# Patient Record
Sex: Male | Born: 1990 | Race: White | Hispanic: No | Marital: Single | Smoking: Current some day smoker
Health system: Southern US, Community
[De-identification: ages and names within clinical notes are randomized; demographics above are authoritative.]

## PROBLEM LIST (undated history)

## (undated) DIAGNOSIS — M940 Chondrocostal junction syndrome [Tietze]: Secondary | ICD-10-CM

## (undated) HISTORY — PX: SHOULDER SURGERY: SHX246

## (undated) HISTORY — PX: OTHER SURGICAL HISTORY: SHX169

---
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
## 2015-09-18 ENCOUNTER — Ambulatory Visit: Payer: Self-pay | Admitting: Nurse Practitioner

## 2016-05-19 ENCOUNTER — Emergency Department (HOSPITAL_COMMUNITY): Payer: BLUE CROSS/BLUE SHIELD

## 2016-05-19 ENCOUNTER — Emergency Department (HOSPITAL_COMMUNITY)
Admission: EM | Admit: 2016-05-19 | Discharge: 2016-05-19 | Disposition: A | Discharge status: Home / Self Care | Payer: BLUE CROSS/BLUE SHIELD | Attending: Emergency Medicine | Admitting: Emergency Medicine

## 2016-05-19 ENCOUNTER — Encounter (HOSPITAL_COMMUNITY): Payer: Self-pay | Admitting: Emergency Medicine

## 2016-05-19 DIAGNOSIS — Y939 Activity, unspecified: Secondary | ICD-10-CM | POA: Diagnosis not present

## 2016-05-19 DIAGNOSIS — F17200 Nicotine dependence, unspecified, uncomplicated: Secondary | ICD-10-CM | POA: Diagnosis not present

## 2016-05-19 DIAGNOSIS — Y999 Unspecified external cause status: Secondary | ICD-10-CM | POA: Diagnosis not present

## 2016-05-19 DIAGNOSIS — S299XXA Unspecified injury of thorax, initial encounter: Secondary | ICD-10-CM | POA: Diagnosis present

## 2016-05-19 DIAGNOSIS — M940 Chondrocostal junction syndrome [Tietze]: Secondary | ICD-10-CM | POA: Diagnosis not present

## 2016-05-19 DIAGNOSIS — Y929 Unspecified place or not applicable: Secondary | ICD-10-CM | POA: Insufficient documentation

## 2016-05-19 DIAGNOSIS — X58XXXA Exposure to other specified factors, initial encounter: Secondary | ICD-10-CM | POA: Insufficient documentation

## 2016-05-19 HISTORY — DX: Chondrocostal junction syndrome (tietze): M94.0

## 2016-05-19 MED ORDER — PREDNISONE 10 MG PO TABS: ORAL_TABLET | ORAL | 0 refills | Status: DC

## 2016-05-19 MED ORDER — TRAMADOL HCL 50 MG PO TABS: 50.0000 mg | ORAL_TABLET | Freq: Four times a day (QID) | ORAL | 0 refills | Status: DC | PRN

## 2016-05-19 MED ORDER — TRAMADOL HCL 50 MG PO TABS
50.0000 mg | ORAL_TABLET | Freq: Once | ORAL | Status: AC
  Administered 2016-05-19: 50 mg via ORAL
  Filled 2016-05-19: qty 1

## 2016-05-19 MED ORDER — PREDNISONE 50 MG PO TABS
60.0000 mg | ORAL_TABLET | Freq: Once | ORAL | Status: AC
  Administered 2016-05-19: 60 mg via ORAL
  Filled 2016-05-19: qty 1

## 2016-05-19 NOTE — ED Triage Notes (Signed)
Pt reports was diagnosed with costochondritis and reports recent flare-up started last Sunday. Pt reports otc medication is not helping with pain anymore. Pt denies any new or recent injury.

## 2016-05-19 NOTE — ED Provider Notes (Signed)
[facility] DEPT Provider Note   CSN: [id number] Arrival date & time: 05/19/16  1547  First Provider Contact:  None    By signing my name below, I, Majel Homer, attest that this documentation has been prepared under the direction and in the presence of non-physician practitioner, Burgess Amor, PA-C. Electronically Signed: Majel Homer, Scribe. 05/19/2016. 5:42 PM.  History   Chief Complaint Chief Complaint  Patient presents with  . Rib Injury   The history is provided by the patient. No language interpreter was used.   HPI Comments: John Mcpherson is a 25 y.o. male with PMHx of costochondritis and heart arrhythmia, who presents to the Emergency Department complaining of gradually worsening, intermittent, "dull," left rib pain that began 1 week ago and worsened today. 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 He states his pain is exacerbated when taking a deep breath and during certain stretching movements. He reports he takes ibuprofen and aleve every day for his pain and has also tried ice and heat with relief. He states his last dose of Ibuprofen was 2 hours PTA. 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 He also denies recent injury or illness, shortness of breath, back pain, nausea and vomiting. Pt reports he was diagnosed with costochondritis as a young child and was referred to a specialist but was never told of a direct cause of his pain. He reports a hx of smoking but states he has decreased recently as it exacerbated his pain; he denies drinking EtOH and illicit drug use. He states he has an appointment scheduled to see a PCP in [place] next week.   Past Medical History:  Diagnosis Date  . Costochondritis    Patient Active Problem List   Diagnosis Date Noted  . SUPERIOR GLENOID LABRUM TEAR 04/24/2008  . DISLOCATION CLOSED HUMERUS, ANTERIOR 12/30/2007     Past Surgical History:  Procedure Laterality Date  . rib injury    . SHOULDER SURGERY Right     Home Medications    Prior to Admission medications   Medication Sig Start Date End Date Taking? Authorizing Provider  predniSONE (DELTASONE) 10 MG tablet 6, 5, 4, 3, 2 then 1 tablet by mouth daily for 6 days total. 05/19/16   Burgess Amor, PA-C  traMADol (ULTRAM) 50 MG tablet Take 1 tablet (50 mg total) by mouth every 6 (six) hours as needed. 05/19/16   Burgess Amor, PA-C   Family History No family history on file.  Social History Social History  Substance Use Topics  . Smoking status: Current Every Day Smoker    Packs/day: 1.00  . Smokeless tobacco: Never Used  . Alcohol use No   Allergies   Review of patient's allergies indicates not on file.   Review of Systems Review of Systems  Respiratory: Negative for shortness of breath.   Gastrointestinal: Negative for nausea and vomiting.  Musculoskeletal: Positive for arthralgias (left ribs). Negative for back pain.   Physical Exam Updated Vital Signs BP 140/75 (BP Location: Left Arm)   Pulse 99   Temp 98.5 F (36.9 C) (Oral)   Resp 14   Ht 5\' 6"  (1.676 m)   Wt 141 lb (64 kg)   SpO2 100%   BMI 22.76 kg/m   Physical Exam  Constitutional: He is oriented to person, place, and time. He appears well-developed and well-nourished.  HENT:  Head:  Normocephalic and atraumatic.  Eyes: Conjunctivae are normal. Pupils are equal, round, and reactive to light. Right eye exhibits no discharge. Left eye exhibits no discharge. No scleral icterus.  Neck: Normal range of motion. No JVD present. No tracheal deviation present.  Cardiovascular:  Borderline tachycardic; rare pvc appreciated  Pulmonary/Chest: Effort normal. No stridor. No respiratory distress. He has no rales. He exhibits tenderness.  Tender along left anterior chest wall at the mid axillary line without palpable deformity, no crepitus.  Lungs are clear bilaterally.   Musculoskeletal: Normal range of motion. He exhibits no edema or tenderness.  Neurological: He is alert and oriented to person, place, and time. Coordination normal.  Skin:  Skin is intact without rash or lesions.  Psychiatric: He has a normal mood and affect. His behavior is normal. Judgment and thought content normal.  Nursing note and vitals reviewed.  ED Treatments / Results  Labs (all labs ordered are listed, but only abnormal results are displayed) Labs Reviewed - No data to display  EKG  EKG Interpretation  Date/Time:  Monday May 19 2016 17:41:30 EDT Ventricular Rate:  83 PR Interval:  124 QRS Duration: 82 QT Interval:  328 QTC Calculation: 385 R Axis:   80 Text Interpretation:  Normal sinus rhythm Normal ECG No previous ECGs available Confirmed by Bebe Shaggy  MD, DONALD ([id number]) on 05/19/2016 5:50:57 PM      Radiology Dg Ribs Unilateral W/chest Left  Result Date: 05/19/2016 CLINICAL DATA:  Left rib pain without known injury. EXAM: LEFT RIBS AND CHEST - 3+ VIEW COMPARISON:  None. FINDINGS: No fracture or other bone lesions are seen involving the ribs. There is no evidence of pneumothorax or pleural effusion. Both lungs are clear. Heart size and mediastinal contours are within normal limits. IMPRESSION: Normal left ribs.  No acute cardiopulmonary abnormality seen. Electronically Signed   By: Lupita Raider, M.D.   On: 05/19/2016 18:26   Procedures Procedures  DIAGNOSTIC STUDIES:  Oxygen Saturation is 100% on RA, normal by my interpretation.    COORDINATION OF CARE:  5:36 PM Discussed treatment plan, which includes X-ray of his ribs and EKG with pt at bedside and pt agreed to plan.  Medications Ordered in ED Medications  predniSONE (DELTASONE) tablet 60 mg (60 mg Oral Given 05/19/16 1915)  traMADol (ULTRAM) tablet 50 mg (50 mg Oral Given 05/19/16 1915)    Initial Impression / Assessment and Plan / ED Course  I have reviewed the triage vital signs and the nursing  notes.  Pertinent labs & imaging results that were available during my care of the patient were reviewed by me and considered in my medical decision making (see chart for details).  Clinical Course    Reproducible chest wall pain in a patient with chronic intermittent sx of unclear etiology.  He is very active in his job in [employer], possibly activity related chronic strain.  Ekg and cxr normal. No exam findings or risk factors suggesting PE.  He was prescribed prednisone and tramadol, advised heat tx.  F/u with new pcp next week as already planned.  Return here in the interim for any worsened sx.   I personally performed the services described in this documentation, which was scribed in my presence. The recorded information has been reviewed and is accurate.   Final Clinical Impressions(s) / ED Diagnoses   Final diagnoses:  Acute costochondritis    New Prescriptions Discharge Medication List as of 05/19/2016  7:09 PM    START taking these medications  Details  predniSONE (DELTASONE) 10 MG tablet 6, 5, 4, 3, 2 then 1 tablet by mouth daily for 6 days total., Print    traMADol (ULTRAM) 50 MG tablet Take 1 tablet (50 mg total) by mouth every 6 (six) hours as needed., Starting Mon 05/19/2016, Print         Burgess AmorJulie Lysander Calixte, PA-C 05/20/16 1410    Zadie Rhineonald Wickline, MD 05/21/16 [id number]

## 2016-07-12 ENCOUNTER — Emergency Department (HOSPITAL_COMMUNITY)
Admission: EM | Admit: 2016-07-12 | Discharge: 2016-07-12 | Disposition: A | Discharge status: Home / Self Care | Payer: BLUE CROSS/BLUE SHIELD | Attending: Emergency Medicine | Admitting: Emergency Medicine

## 2016-07-12 ENCOUNTER — Encounter (HOSPITAL_COMMUNITY): Payer: Self-pay | Admitting: Emergency Medicine

## 2016-07-12 ENCOUNTER — Emergency Department (HOSPITAL_COMMUNITY): Payer: BLUE CROSS/BLUE SHIELD

## 2016-07-12 DIAGNOSIS — R079 Chest pain, unspecified: Secondary | ICD-10-CM | POA: Diagnosis not present

## 2016-07-12 DIAGNOSIS — Z79899 Other long term (current) drug therapy: Secondary | ICD-10-CM | POA: Insufficient documentation

## 2016-07-12 DIAGNOSIS — Z7982 Long term (current) use of aspirin: Secondary | ICD-10-CM | POA: Insufficient documentation

## 2016-07-12 DIAGNOSIS — F17210 Nicotine dependence, cigarettes, uncomplicated: Secondary | ICD-10-CM | POA: Diagnosis not present

## 2016-07-12 MED ORDER — AMOXICILLIN-POT CLAVULANATE 875-125 MG PO TABS: 1.0000 | ORAL_TABLET | Freq: Two times a day (BID) | ORAL | 0 refills | Status: DC

## 2016-07-12 MED ORDER — KETOROLAC TROMETHAMINE 60 MG/2ML IM SOLN
60.0000 mg | Freq: Once | INTRAMUSCULAR | Status: AC
  Administered 2016-07-12: 60 mg via INTRAMUSCULAR
  Filled 2016-07-12: qty 2

## 2016-07-12 MED ORDER — IBUPROFEN 800 MG PO TABS: 800.0000 mg | ORAL_TABLET | Freq: Three times a day (TID) | ORAL | 0 refills | Status: AC

## 2016-07-12 NOTE — Discharge Instructions (Signed)
Your xray shows some abnormal Right lung findings that may be a pneumonia - you MUST have a recheck in 2 weeks with repeat xray - come back to ER sooner if your symptoms worsen such as chest pain, cough or fevers or difficulty breathing.

## 2016-07-12 NOTE — ED Triage Notes (Signed)
Pt presents with complaints of right sided chest pain,  described as a sharp shooting pain.  Pt states the pain makes it hard to take a deep breath. Pt states this pain has been present for last couple days. Pt states the pain is more so in the middle of the right ribs and extends towards the back.

## 2016-07-12 NOTE — ED Notes (Signed)
Patient transported to X-ray 

## 2016-07-12 NOTE — ED Provider Notes (Signed)
[facility] Provider Note   CSN: [id number] Arrival date & time: 07/12/16  1028  By signing my name below, I, Valentino SaxonBianca Contreras and Doreatha MartinEva Mathews, attest that this documentation has been prepared under the direction and in the presence of Eber HongBrian Burgandy Hackworth, MD. Electronically Signed: Valentino SaxonBianca Contreras and Doreatha MartinEva Mathews, ED Scribe. 07/12/16. 10:50 AM.    History   Chief Complaint Chief Complaint  Patient presents with  . Chest Pain    The history is provided by the patient. No language interpreter was used.     HPI Comments: John Mcpherson is a 25 y.o. male with h/o recurrent left-sided costochondritis who presents to the Emergency Department complaining of moderate, stabbing right-sided CP onset 2 days ago while at work. Pt denies any heavy lifting, falls or trauma to the chest. He also notes an occasional cough. Pt states his current pain is not similar to prior episodes of left-sided costochondritis. He notes he is normally able to take Aleve with resolution of his recurrent pain. Pt states he has taken Aleve and ibuprofen for his current pain with no relief. Pt states his pain is exacerbated by coughing, sneezing, in certain positions and with any breathing. Pt reports recent 5 hr car travel to [place]. He states he is not exposed to chemicals or insulation at work. Pt is a current occasional smoker. Pt denies fever, leg swelling, back pain, substernal CP, left-sided CP.     Past Medical History:  Diagnosis Date  . Costochondritis     Patient Active Problem List   Diagnosis Date Noted  . SUPERIOR GLENOID LABRUM TEAR 04/24/2008  . DISLOCATION CLOSED HUMERUS, ANTERIOR 12/30/2007    Past Surgical History:  Procedure Laterality Date  . rib injury    . SHOULDER SURGERY Right        Home Medications    Prior to Admission medications   Medication Sig Start Date End Date Taking? Authorizing Provider  acetaminophen (TYLENOL) 500 MG tablet Take 1,000 mg by mouth every 6 (six)  hours as needed for mild pain.   Yes Historical Provider, MD  naproxen sodium (ANAPROX) 220 MG tablet Take 440 mg by mouth daily as needed.   Yes Historical Provider, MD  amoxicillin-clavulanate (AUGMENTIN) 875-125 MG tablet Take 1 tablet by mouth every 12 (twelve) hours. 07/12/16   Eber HongBrian Norm Wray, MD  ibuprofen (ADVIL,MOTRIN) 800 MG tablet Take 1 tablet (800 mg total) by mouth 3 (three) times daily. 07/12/16   Eber HongBrian Lorece Keach, MD    Family History No family history on file.  Social History Social History  Substance Use Topics  . Smoking status: Current Some Day Smoker    Packs/day: 0.25    Types: Cigarettes  . Smokeless tobacco: Never Used  . Alcohol use No     Allergies   Review of patient's allergies indicates no known allergies.   Review of Systems Review of Systems  Constitutional: Negative for fever.  Respiratory: Positive for cough.   Cardiovascular: Positive for chest pain. Negative for leg swelling.    Physical Exam Updated Vital Signs BP 124/74   Pulse 101   Temp 99.2 F (37.3 C) (Oral)   Resp 18   Ht 5\' 7"  (1.702 m)   Wt 145 lb (65.8 kg)   SpO2 100%   BMI 22.71 kg/m   Physical Exam  Constitutional: He appears well-developed and well-nourished. No distress.  HENT:  Head: Normocephalic and atraumatic.  Mouth/Throat: Oropharynx is clear and moist. No oropharyngeal exudate.  Eyes: Conjunctivae and EOM  are normal. Pupils are equal, round, and reactive to light. Right eye exhibits no discharge. Left eye exhibits no discharge. No scleral icterus.  Neck: Normal range of motion. Neck supple. No JVD present. No thyromegaly present.  Cardiovascular: Normal rate, regular rhythm, normal heart sounds and intact distal pulses.  Exam reveals no gallop and no friction rub.   No murmur heard. Pulmonary/Chest: Effort normal and breath sounds normal. No respiratory distress. He has no wheezes. He has no rales. He exhibits tenderness.  Tenderness over right rib. Unable to take a  deep breath d/t pain. Breathing truncated by pain  Abdominal: Soft. Bowel sounds are normal. He exhibits no distension and no mass. There is no tenderness.  Musculoskeletal: Normal range of motion. He exhibits no edema or tenderness.  Lymphadenopathy:    He has no cervical adenopathy.  Neurological: He is alert. Coordination normal.  Skin: Skin is warm and dry. No rash noted. No erythema.  Psychiatric: He has a normal mood and affect. His behavior is normal.  Nursing note and vitals reviewed.    ED Treatments / Results   DIAGNOSTIC STUDIES: Oxygen Saturation is 100% on RA, normal by my interpretation.    COORDINATION OF CARE: 10:58 AM Discussed treatment plan with pt at bedside which includes CXR and pt agreed to plan.  Labs (all labs ordered are listed, but only abnormal results are displayed) Labs Reviewed - No data to display  EKG  EKG Interpretation None       Radiology Dg Chest 2 View  Result Date: 07/12/2016 CLINICAL DATA:  Pain. EXAM: CHEST  2 VIEW COMPARISON:  May 19, 2016 FINDINGS: No pneumothorax. The heart, hila, and mediastinum are normal. The left lung is clear. Mild opacity is seen in the right costophrenic angle with minimal blunting identified as well. No other abnormalities are identified. IMPRESSION: Mild opacity and effusion in the region of the right costophrenic angle. Recommend follow-up to resolution. No other acute abnormalities are identified. Electronically Signed   By: Gerome Sam III M.D   On: 07/12/2016 11:19    Procedures Procedures (including critical care time)  Medications Ordered in ED Medications  ketorolac (TORADOL) injection 60 mg (60 mg Intramuscular Given 07/12/16 1144)     Initial Impression / Assessment and Plan / ED Course  I have reviewed the triage vital signs and the nursing notes.  Pertinent labs & imaging results that were available during my care of the patient were reviewed by me and considered in my medical  decision making (see chart for details).  Clinical Course   Lung exam is normal Pt informed of results - I showed him the xray pictures in his room on CPU screen Will treat for possible PNA Pt will need f/u in 2 weeks for repeat He is in agreement with plan nsaids for pain  Final Clinical Impressions(s) / ED Diagnoses   Final diagnoses:  Nonspecific chest pain    New Prescriptions Discharge Medication List as of 07/12/2016 11:34 AM    START taking these medications   Details  amoxicillin-clavulanate (AUGMENTIN) 875-125 MG tablet Take 1 tablet by mouth every 12 (twelve) hours., Starting Sat 07/12/2016, Print        I personally performed the services described in this documentation, which was scribed in my presence. The recorded information has been reviewed and is accurate.        Eber Hong, MD 07/12/16 [phone]

## 2016-12-26 ENCOUNTER — Ambulatory Visit (HOSPITAL_BASED_OUTPATIENT_CLINIC_OR_DEPARTMENT_OTHER): Payer: BLUE CROSS/BLUE SHIELD | Admitting: Anesthesiology

## 2016-12-26 ENCOUNTER — Ambulatory Visit (HOSPITAL_BASED_OUTPATIENT_CLINIC_OR_DEPARTMENT_OTHER)
Admission: RE | Admit: 2016-12-26 | Discharge: 2016-12-26 | Disposition: A | Discharge status: Home / Self Care | Payer: BLUE CROSS/BLUE SHIELD | Source: Ambulatory Visit | Attending: Orthopedic Surgery | Admitting: Orthopedic Surgery

## 2016-12-26 ENCOUNTER — Encounter (HOSPITAL_BASED_OUTPATIENT_CLINIC_OR_DEPARTMENT_OTHER): Payer: Self-pay | Admitting: *Deleted

## 2016-12-26 ENCOUNTER — Other Ambulatory Visit (HOSPITAL_COMMUNITY): Payer: Self-pay | Admitting: Physician Assistant

## 2016-12-26 ENCOUNTER — Encounter (HOSPITAL_BASED_OUTPATIENT_CLINIC_OR_DEPARTMENT_OTHER)
Admission: RE | Disposition: A | Discharge status: Home / Self Care | Payer: Self-pay | Source: Ambulatory Visit | Attending: Orthopedic Surgery

## 2016-12-26 DIAGNOSIS — B957 Other staphylococcus as the cause of diseases classified elsewhere: Secondary | ICD-10-CM | POA: Diagnosis not present

## 2016-12-26 DIAGNOSIS — L02413 Cutaneous abscess of right upper limb: Secondary | ICD-10-CM | POA: Diagnosis present

## 2016-12-26 DIAGNOSIS — F17200 Nicotine dependence, unspecified, uncomplicated: Secondary | ICD-10-CM | POA: Diagnosis not present

## 2016-12-26 HISTORY — PX: INCISION AND DRAINAGE ABSCESS: SHX5864

## 2016-12-26 LAB — RAPID URINE DRUG SCREEN, HOSP PERFORMED
Amphetamines: NOT DETECTED
Barbiturates: NOT DETECTED
Benzodiazepines: NOT DETECTED
COCAINE: NOT DETECTED
OPIATES: NOT DETECTED
Tetrahydrocannabinol: POSITIVE — AB

## 2016-12-26 SURGERY — INCISION AND DRAINAGE, ABSCESS
Anesthesia: General | Site: Shoulder | Laterality: Right

## 2016-12-26 MED ORDER — FENTANYL CITRATE (PF) 100 MCG/2ML IJ SOLN
INTRAMUSCULAR | Status: AC
  Filled 2016-12-26: qty 2

## 2016-12-26 MED ORDER — PROPOFOL 10 MG/ML IV BOLUS
INTRAVENOUS | Status: DC | PRN
  Administered 2016-12-26: 200 mg via INTRAVENOUS

## 2016-12-26 MED ORDER — ONDANSETRON HCL 4 MG PO TABS: 4.0000 mg | ORAL_TABLET | Freq: Three times a day (TID) | ORAL | 0 refills | Status: AC | PRN

## 2016-12-26 MED ORDER — FENTANYL CITRATE (PF) 100 MCG/2ML IJ SOLN
INTRAMUSCULAR | Status: DC | PRN
  Administered 2016-12-26: 50 ug via INTRAVENOUS
  Administered 2016-12-26: 100 ug via INTRAVENOUS

## 2016-12-26 MED ORDER — CEFAZOLIN SODIUM-DEXTROSE 2-4 GM/100ML-% IV SOLN
INTRAVENOUS | Status: AC
  Filled 2016-12-26: qty 100

## 2016-12-26 MED ORDER — PROMETHAZINE HCL 25 MG/ML IJ SOLN: 6.2500 mg | INTRAMUSCULAR | Status: DC | PRN

## 2016-12-26 MED ORDER — OXYCODONE-ACETAMINOPHEN 5-325 MG PO TABS: 1.0000 | ORAL_TABLET | ORAL | 0 refills | Status: AC | PRN

## 2016-12-26 MED ORDER — ONDANSETRON HCL 4 MG/2ML IJ SOLN
INTRAMUSCULAR | Status: DC | PRN
  Administered 2016-12-26: 4 mg via INTRAVENOUS

## 2016-12-26 MED ORDER — HYDROMORPHONE HCL 1 MG/ML IJ SOLN
0.2500 mg | INTRAMUSCULAR | Status: DC | PRN
  Administered 2016-12-26 (×2): 0.5 mg via INTRAVENOUS

## 2016-12-26 MED ORDER — CEFAZOLIN SODIUM-DEXTROSE 2-4 GM/100ML-% IV SOLN
2.0000 g | INTRAVENOUS | Status: AC
  Administered 2016-12-26: 2 g via INTRAVENOUS

## 2016-12-26 MED ORDER — DEXAMETHASONE SODIUM PHOSPHATE 10 MG/ML IJ SOLN
INTRAMUSCULAR | Status: AC
  Filled 2016-12-26: qty 1

## 2016-12-26 MED ORDER — METHOCARBAMOL 500 MG PO TABS: 500.0000 mg | ORAL_TABLET | Freq: Four times a day (QID) | ORAL | 0 refills | Status: AC | PRN

## 2016-12-26 MED ORDER — OXYCODONE HCL 5 MG PO TABS
ORAL_TABLET | ORAL | Status: AC
  Filled 2016-12-26: qty 1

## 2016-12-26 MED ORDER — BUPIVACAINE HCL (PF) 0.5 % IJ SOLN
INTRAMUSCULAR | Status: AC
  Filled 2016-12-26: qty 30

## 2016-12-26 MED ORDER — ONDANSETRON HCL 4 MG/2ML IJ SOLN
INTRAMUSCULAR | Status: AC
  Filled 2016-12-26: qty 2

## 2016-12-26 MED ORDER — MIDAZOLAM HCL 5 MG/5ML IJ SOLN
INTRAMUSCULAR | Status: DC | PRN
  Administered 2016-12-26: 2 mg via INTRAVENOUS

## 2016-12-26 MED ORDER — DEXAMETHASONE SODIUM PHOSPHATE 4 MG/ML IJ SOLN
INTRAMUSCULAR | Status: DC | PRN
  Administered 2016-12-26: 10 mg via INTRAVENOUS

## 2016-12-26 MED ORDER — SODIUM CHLORIDE 0.9 % IR SOLN
Status: DC | PRN
  Administered 2016-12-26: 3000 mL

## 2016-12-26 MED ORDER — HYDROMORPHONE HCL 1 MG/ML IJ SOLN
INTRAMUSCULAR | Status: AC
  Filled 2016-12-26: qty 1

## 2016-12-26 MED ORDER — LACTATED RINGERS IV SOLN
INTRAVENOUS | Status: DC
  Administered 2016-12-26: 14:00:00 via INTRAVENOUS

## 2016-12-26 MED ORDER — BUPIVACAINE HCL (PF) 0.5 % IJ SOLN
INTRAMUSCULAR | Status: DC | PRN
Start: 2016-12-26 — End: 2016-12-26
  Administered 2016-12-26: 30 mL

## 2016-12-26 MED ORDER — SUCCINYLCHOLINE CHLORIDE 200 MG/10ML IV SOSY
PREFILLED_SYRINGE | INTRAVENOUS | Status: DC | PRN
  Administered 2016-12-26: 100 mg via INTRAVENOUS

## 2016-12-26 MED ORDER — PROPOFOL 10 MG/ML IV BOLUS
INTRAVENOUS | Status: AC
  Filled 2016-12-26: qty 20

## 2016-12-26 MED ORDER — LIDOCAINE 2% (20 MG/ML) 5 ML SYRINGE
INTRAMUSCULAR | Status: AC
  Filled 2016-12-26: qty 5

## 2016-12-26 MED ORDER — CHLORHEXIDINE GLUCONATE 4 % EX LIQD
60.0000 mL | Freq: Once | CUTANEOUS | Status: DC
Start: 2016-12-26 — End: 2016-12-26

## 2016-12-26 MED ORDER — CHLORHEXIDINE GLUCONATE 4 % EX LIQD: 60.0000 mL | Freq: Once | CUTANEOUS | Status: DC

## 2016-12-26 MED ORDER — OXYCODONE HCL 5 MG/5ML PO SOLN: 5.0000 mg | Freq: Once | ORAL | Status: AC | PRN

## 2016-12-26 MED ORDER — OXYCODONE HCL 5 MG PO TABS
5.0000 mg | ORAL_TABLET | Freq: Once | ORAL | Status: AC | PRN
  Administered 2016-12-26: 5 mg via ORAL

## 2016-12-26 MED ORDER — SUCCINYLCHOLINE CHLORIDE 200 MG/10ML IV SOSY
PREFILLED_SYRINGE | INTRAVENOUS | Status: AC
  Filled 2016-12-26: qty 10

## 2016-12-26 MED ORDER — MIDAZOLAM HCL 2 MG/2ML IJ SOLN
INTRAMUSCULAR | Status: AC
  Filled 2016-12-26: qty 2

## 2016-12-26 MED ORDER — LIDOCAINE 2% (20 MG/ML) 5 ML SYRINGE
INTRAMUSCULAR | Status: DC | PRN
  Administered 2016-12-26: 100 mg via INTRAVENOUS

## 2016-12-26 MED ORDER — ARTIFICIAL TEARS OP OINT
TOPICAL_OINTMENT | OPHTHALMIC | Status: AC
  Filled 2016-12-26: qty 3.5

## 2016-12-26 SURGICAL SUPPLY — 65 items
AID PSTN UNV HD RSTRNT DISP (MISCELLANEOUS)
BLADE CLIPPER SURG (BLADE) IMPLANT
BLADE SURG 15 STRL LF DISP TIS (BLADE) ×3 IMPLANT
BLADE SURG 15 STRL SS (BLADE) ×4
CHLORAPREP W/TINT 26ML (MISCELLANEOUS) ×4 IMPLANT
CLOSURE STERI-STRIP 1/2X4 (GAUZE/BANDAGES/DRESSINGS)
CLSR STERI-STRIP ANTIMIC 1/2X4 (GAUZE/BANDAGES/DRESSINGS) IMPLANT
DECANTER SPIKE VIAL GLASS SM (MISCELLANEOUS) IMPLANT
DRAIN PENROSE 1/4X12 LTX STRL (WOUND CARE) ×3 IMPLANT
DRAPE ARTHROSCOPY W/POUCH 114 (DRAPES) ×3 IMPLANT
DRAPE IMP U-DRAPE 54X76 (DRAPES) ×7 IMPLANT
DRAPE INCISE IOBAN 66X45 STRL (DRAPES) ×4 IMPLANT
DRAPE OEC MINIVIEW 54X84 (DRAPES) IMPLANT
DRAPE U-SHAPE 47X51 STRL (DRAPES) ×4 IMPLANT
DRAPE U-SHAPE 76X120 STRL (DRAPES) ×2 IMPLANT
DRSG MEPILEX BORDER 4X8 (GAUZE/BANDAGES/DRESSINGS) IMPLANT
DRSG PAD ABDOMINAL 8X10 ST (GAUZE/BANDAGES/DRESSINGS) ×6 IMPLANT
DRSG TEGADERM 4X4.75 (GAUZE/BANDAGES/DRESSINGS) IMPLANT
ELECT REM PT RETURN 9FT ADLT (ELECTROSURGICAL) ×4
ELECTRODE REM PT RTRN 9FT ADLT (ELECTROSURGICAL) ×2 IMPLANT
GAUZE SPONGE 4X4 12PLY STRL (GAUZE/BANDAGES/DRESSINGS) IMPLANT
GAUZE SPONGE 4X4 12PLY STRL LF (GAUZE/BANDAGES/DRESSINGS) ×3 IMPLANT
GAUZE XEROFORM 1X8 LF (GAUZE/BANDAGES/DRESSINGS) IMPLANT
GLOVE BIO SURGEON STRL SZ 6.5 (GLOVE) ×2 IMPLANT
GLOVE BIO SURGEON STRL SZ7.5 (GLOVE) ×14 IMPLANT
GLOVE BIO SURGEONS STRL SZ 6.5 (GLOVE) ×1
GLOVE BIOGEL PI IND STRL 7.0 (GLOVE) ×1 IMPLANT
GLOVE BIOGEL PI IND STRL 8 (GLOVE) ×4 IMPLANT
GLOVE BIOGEL PI INDICATOR 7.0 (GLOVE) ×2
GLOVE BIOGEL PI INDICATOR 8 (GLOVE) ×4
GOWN STRL REUS W/ TWL LRG LVL3 (GOWN DISPOSABLE) ×2 IMPLANT
GOWN STRL REUS W/ TWL XL LVL3 (GOWN DISPOSABLE) ×4 IMPLANT
GOWN STRL REUS W/TWL LRG LVL3 (GOWN DISPOSABLE) ×4
GOWN STRL REUS W/TWL XL LVL3 (GOWN DISPOSABLE) ×8
MANIFOLD NEPTUNE II (INSTRUMENTS) ×3 IMPLANT
NS IRRIG 1000ML POUR BTL (IV SOLUTION) ×4 IMPLANT
PACK ARTHROSCOPY DSU (CUSTOM PROCEDURE TRAY) ×4 IMPLANT
PACK BASIN DAY SURGERY FS (CUSTOM PROCEDURE TRAY) ×4 IMPLANT
PENCIL BUTTON HOLSTER BLD 10FT (ELECTRODE) ×4 IMPLANT
RESTRAINT HEAD UNIVERSAL NS (MISCELLANEOUS) ×1 IMPLANT
SET IRRIG Y TYPE TUR BLADDER L (SET/KITS/TRAYS/PACK) ×3 IMPLANT
SLEEVE SCD COMPRESS KNEE MED (MISCELLANEOUS) ×4 IMPLANT
SLING ARM FOAM STRAP LRG (SOFTGOODS) ×3 IMPLANT
SLING ARM IMMOBILIZER LRG (SOFTGOODS) IMPLANT
SLING ARM IMMOBILIZER MED (SOFTGOODS) IMPLANT
SLING ARM MED ADULT FOAM STRAP (SOFTGOODS) IMPLANT
SLING ARM XL FOAM STRAP (SOFTGOODS) IMPLANT
SPONGE LAP 18X18 X RAY DECT (DISPOSABLE) ×4 IMPLANT
SUCTION FRAZIER HANDLE 10FR (MISCELLANEOUS)
SUCTION TUBE FRAZIER 10FR DISP (MISCELLANEOUS) IMPLANT
SUT ETHILON 3 0 PS 1 (SUTURE) ×3 IMPLANT
SUT FIBERWIRE #2 38 T-5 BLUE (SUTURE)
SUT MON AB 2-0 CT1 36 (SUTURE) ×1 IMPLANT
SUT MON AB 4-0 PC3 18 (SUTURE) IMPLANT
SUT VIC AB 0 CT1 27 (SUTURE)
SUT VIC AB 0 CT1 27XBRD ANBCTR (SUTURE) ×1 IMPLANT
SUT VIC AB 2-0 SH 27 (SUTURE)
SUT VIC AB 2-0 SH 27XBRD (SUTURE) IMPLANT
SUT VIC AB 3-0 SH 27 (SUTURE)
SUT VIC AB 3-0 SH 27X BRD (SUTURE) IMPLANT
SUTURE FIBERWR #2 38 T-5 BLUE (SUTURE) IMPLANT
SYR BULB 3OZ (MISCELLANEOUS) ×4 IMPLANT
TOWEL OR 17X24 6PK STRL BLUE (TOWEL DISPOSABLE) ×4 IMPLANT
TOWEL OR NON WOVEN STRL DISP B (DISPOSABLE) ×4 IMPLANT
YANKAUER SUCT BULB TIP NO VENT (SUCTIONS) ×4 IMPLANT

## 2016-12-26 NOTE — Op Note (Signed)
12/26/2016  3:34 PM  PATIENT:  John Mcpherson    PRE-OPERATIVE DIAGNOSIS:  Right shoulder abscess  POST-OPERATIVE DIAGNOSIS:  Same  PROCEDURE:  INCISION AND DRAINAGE  SURGEON:  Bayla Mcgovern, Jewel BaizeIMOTHY D, MD  ASSISTANT: Aquilla HackerHenry Martensen, PA-C, he was present and scrubbed throughout the case, critical for completion in a timely fashion, and for retraction, instrumentation, and closure.   ANESTHESIA:   gen  PREOPERATIVE INDICATIONS:  John Mcpherson is a  26 y.o. male with a diagnosis of Right shoulder abscess who failed conservative measures and elected for surgical management.    The risks benefits and alternatives were discussed with the patient preoperatively including but not limited to the risks of infection, bleeding, nerve injury, cardiopulmonary complications, the need for revision surgery, among others, and the patient was willing to proceed.  OPERATIVE IMPLANTS: none  OPERATIVE FINDINGS: purulent fluid   BLOOD LOSS: min  COMPLICATIONS: none  TOURNIQUET TIME: none  OPERATIVE PROCEDURE:  Patient was identified in the preoperative holding area and site was marked by me He was transported to the operating theater and placed on the table in supine position taking care to pad all bony prominences. After a preincinduction time out anesthesia was induced. The right upper extremity was prepped and draped in normal sterile fashion and a pre-incision timeout was performed. He received ancef after culture collection for preoperative antibiotics.   I made a incision through his previous anterior shoulder incision. Immediately a large amount of purulent fluid was expressed and collected for culture I then dosed him with Ancef for antibiotics. I bluntly dissected in the plane of the abscess that tracked down towards his deltoid insertion and into the lateral arm slightly. I confirm that we expressed all fluid I probed the entire pocket there were no other areas of fluid collection it did not  go into his shoulder joint I was able to feel anterior capsule but there was no rent or hole in this. There is no expressed fluid by moving the shoulder or screws squeezing on his capsule.  I then copiously irrigated his abscess I debrided of all necrotic tissue. I used scissors knife and a Cobb to debride this. This was an excisional debridement.  After this debridement I performed another copious irrigation I then placed a Penrose drains and closed the skin with a nylon stitch. He was awoken and taken the PACU in stable condition  POST OPERATIVE PLAN: He'll start by mouth antibiotics to use a sling for comfort follow-up early next week for a wound check

## 2016-12-26 NOTE — Anesthesia Preprocedure Evaluation (Addendum)
Anesthesia Evaluation  Patient identified by MRN, date of birth, ID band Patient awake    Reviewed: Allergy & Precautions, NPO status , Patient's Chart, lab work & pertinent test results  Airway Mallampati: II  TM Distance: >3 FB Neck ROM: Full    Dental  (+) Teeth Intact, Poor Dentition   Pulmonary Current Smoker,    Pulmonary exam normal breath sounds clear to auscultation       Cardiovascular negative cardio ROS Normal cardiovascular exam Rhythm:Regular Rate:Normal     Neuro/Psych negative psych ROS   GI/Hepatic negative GI ROS, (+)     substance abuse  cocaine use and marijuana use,   Endo/Other  negative endocrine ROS  Renal/GU negative Renal ROS  negative genitourinary   Musculoskeletal negative musculoskeletal ROS (+) Infected shoulder site   Abdominal Normal abdominal exam  (+)   Peds  Hematology negative hematology ROS (+)   Anesthesia Other Findings   Reproductive/Obstetrics                           Anesthesia Physical Anesthesia Plan  ASA: II  Anesthesia Plan: General   Post-op Pain Management:    Induction: Intravenous  Airway Management Planned: Oral ETT  Additional Equipment:   Intra-op Plan:   Post-operative Plan: Extubation in OR  Informed Consent: I have reviewed the patients History and Physical, chart, labs and discussed the procedure including the risks, benefits and alternatives for the proposed anesthesia with the patient or authorized representative who has indicated his/her understanding and acceptance.     Plan Discussed with: CRNA and Surgeon  Anesthesia Plan Comments:        Anesthesia Quick Evaluation

## 2016-12-26 NOTE — Addendum Note (Signed)
Addendum  created 12/26/16 1721 by Leilani AbleFranklin Sweet Jarvis, MD   Order list changed, Order sets accessed

## 2016-12-26 NOTE — Anesthesia Procedure Notes (Signed)
Procedure Name: Intubation Date/Time: 12/26/2016 3:40 PM Performed by: Gar GibbonKEETON, Angell Honse S Pre-anesthesia Checklist: Patient identified, Emergency Drugs available, Suction available and Patient being monitored Patient Re-evaluated:Patient Re-evaluated prior to inductionOxygen Delivery Method: Circle system utilized Preoxygenation: Pre-oxygenation with 100% oxygen Intubation Type: IV induction Ventilation: Mask ventilation without difficulty Laryngoscope Size: Miller and 3 Grade View: Grade I Tube type: Oral Tube size: 8.0 mm Number of attempts: 1 Airway Equipment and Method: Stylet and Oral airway Placement Confirmation: ETT inserted through vocal cords under direct vision,  positive ETCO2 and breath sounds checked- equal and bilateral Secured at: 22 cm Tube secured with: Tape Dental Injury: Teeth and Oropharynx as per pre-operative assessment

## 2016-12-26 NOTE — Discharge Instructions (Signed)
Diet: As you were doing prior to hospitalization   Shower:  Keep dressing on, clean and dry until follow up.  Dressing:  Keep on and dry until follow up in 3-5 days.  Activity:  Increase activity slowly as tolerated, but follow the weight bearing instructions below.  The rules on driving is that you can not be taking narcotics while you drive, and you must feel in control of the vehicle.    Weight Bearing:   As tolerated.  Limit strenuous use of right arm.    To prevent constipation: you may use a stool softener such as -  Colace (over the counter) 100 mg by mouth twice a day  Drink plenty of fluids (prune juice may be helpful) and high fiber foods Miralax (over the counter) for constipation as needed.    Itching:  If you experience itching with your medications, try taking only a single pain pill, or even half a pain pill at a time.  You may take up to 10 pain pills per day, and you can also use benadryl over the counter for itching or also to help with sleep.   Precautions:  If you experience chest pain or shortness of breath - call 911 immediately for transfer to the hospital emergency department!!  If you develop a fever greater that 101 F, purulent drainage from wound, increased redness or drainage from wound, or calf pain -- Call the office at [phone]                                                Follow- Up Appointment:  Please call for an appointment to be seen in 3-5 days [place] - [phone]   Post Anesthesia Home Care Instructions  Activity: Get plenty of rest for the remainder of the day. A responsible adult should stay with you for 24 hours following the procedure.  For the next 24 hours, DO NOT: -Drive a car -Advertising copywriterperate machinery -Drink alcoholic beverages -Take any medication unless instructed by your physician -Make any legal decisions or sign important papers.  Meals: Start with liquid foods such as gelatin or soup. Progress to regular foods as tolerated.  Avoid greasy, spicy, heavy foods. If nausea and/or vomiting occur, drink only clear liquids until the nausea and/or vomiting subsides. Call your physician if vomiting continues.  Special Instructions/Symptoms: Your throat may feel dry or sore from the anesthesia or the breathing tube placed in your throat during surgery. If this causes discomfort, gargle with warm salt water. The discomfort should disappear within 24 hours.  If you had a scopolamine patch placed behind your ear for the management of post- operative nausea and/or vomiting:  1. The medication in the patch is effective for 72 hours, after which it should be removed.  Wrap patch in a tissue and discard in the trash. Wash hands thoroughly with soap and water. 2. You may remove the patch earlier than 72 hours if you experience unpleasant side effects which may include dry mouth, dizziness or visual disturbances. 3. Avoid touching the patch. Wash your hands with soap and water after contact with the patch.
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
## 2016-12-26 NOTE — Interval H&P Note (Signed)
History and Physical Interval Note:  12/26/2016 2:11 PM  John Mcpherson  has presented today for surgery, with the diagnosis of Right shoulder abscess  The various methods of treatment have been discussed with the patient and family. After consideration of risks, benefits and other options for treatment, the patient has consented to  Procedure(s): INCISION AND DRAINAGE (Right) as a surgical intervention .  The patient's history has been reviewed, patient examined, no change in status, stable for surgery.  I have reviewed the patient's chart and labs.  Questions were answered to the patient's satisfaction.     Jhoselin Crume D

## 2016-12-26 NOTE — Anesthesia Postprocedure Evaluation (Addendum)
Anesthesia Post Note  Patient: John Mcpherson  Procedure(s) Performed: Procedure(s) (LRB): INCISION AND DRAINAGE RIGHT SHOULDER ABSCESS (Right)  Patient location during evaluation: PACU Anesthesia Type: General Level of consciousness: awake Pain management: pain level controlled Vital Signs Assessment: post-procedure vital signs reviewed and stable Respiratory status: spontaneous breathing Cardiovascular status: blood pressure returned to baseline Postop Assessment: no signs of nausea or vomiting and adequate PO intake Anesthetic complications: no        Last Vitals:  Vitals:   12/26/16 1700 12/26/16 1715  BP: 112/66 118/80  Pulse: 87 94  Resp: 15 (!) 29  Temp:      Last Pain:  Vitals:   12/26/16 1700  TempSrc:   PainSc: 7    Pain Goal: Patients Stated Pain Goal: 3 (12/26/16 1424)               John Mcpherson,John Mcpherson

## 2016-12-26 NOTE — Transfer of Care (Signed)
Immediate Anesthesia Transfer of Care Note  Patient: John Mcpherson  Procedure(s) Performed: Procedure(s): INCISION AND DRAINAGE RIGHT SHOULDER ABSCESS (Right)  Patient Location: PACU  Anesthesia Type:General  Level of Consciousness: awake, sedated and patient cooperative  Airway & Oxygen Therapy: Patient Spontanous Breathing and Patient connected to face mask oxygen  Post-op Assessment: Report given to RN and Post -op Vital signs reviewed and stable  Post vital signs: Reviewed and stable  Last Vitals:  Vitals:   12/26/16 1424  BP: 127/81  Pulse: (!) 114  Resp: 20  Temp: 36.7 C    Last Pain:  Vitals:   12/26/16 1424  TempSrc: Oral  PainSc: 6       Patients Stated Pain Goal: 3 (12/26/16 1424)  Complications: No apparent anesthesia complications

## 2016-12-29 ENCOUNTER — Encounter (HOSPITAL_BASED_OUTPATIENT_CLINIC_OR_DEPARTMENT_OTHER): Payer: Self-pay | Admitting: Orthopedic Surgery

## 2016-12-31 LAB — AEROBIC/ANAEROBIC CULTURE W GRAM STAIN (SURGICAL/DEEP WOUND)

## 2016-12-31 LAB — AEROBIC/ANAEROBIC CULTURE (SURGICAL/DEEP WOUND)

## 2017-01-05 IMAGING — DX DG RIBS W/ CHEST 3+V*L*
5 series · 5 of 5 positions shown · non-contrast
Comparison: None.

CLINICAL DATA: Left rib pain without known injury.

EXAM:
LEFT RIBS AND CHEST - 3+ VIEW

[chest pa]
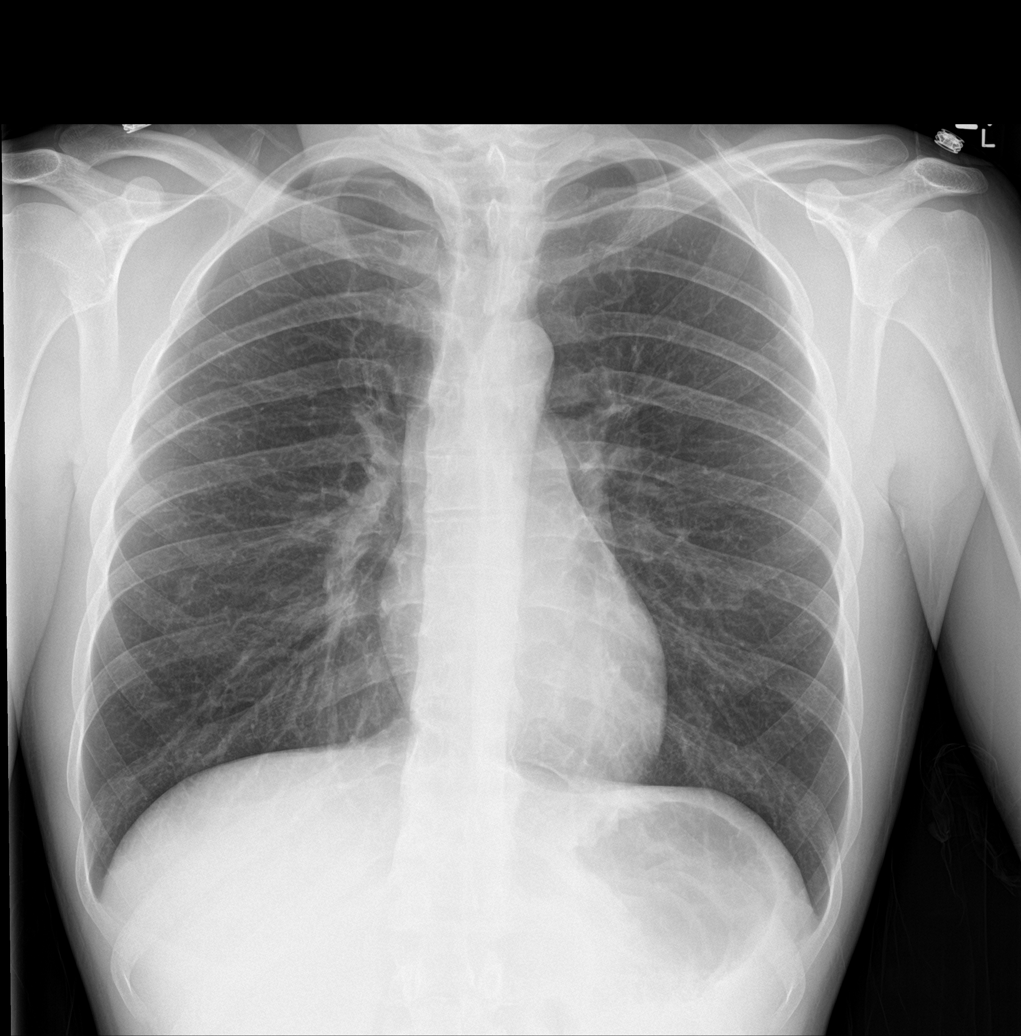

[rib pa obl (1 of 2)]
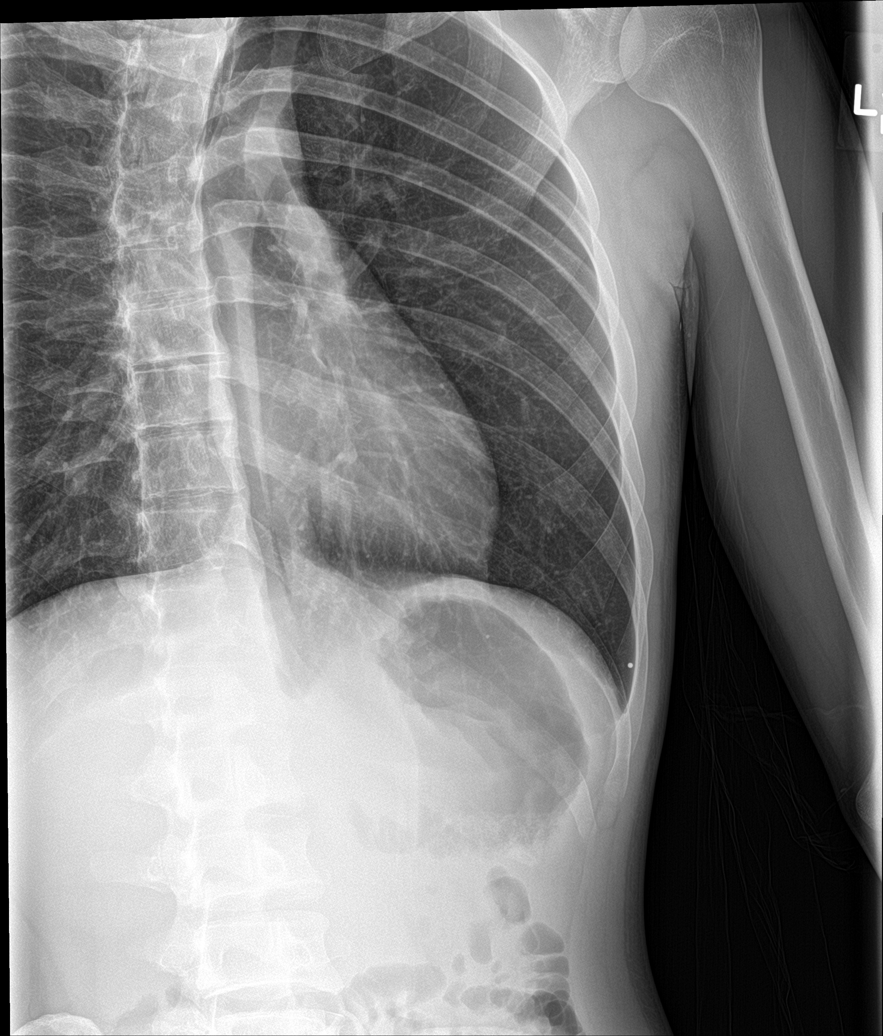

[rib pa obl (2 of 2)]
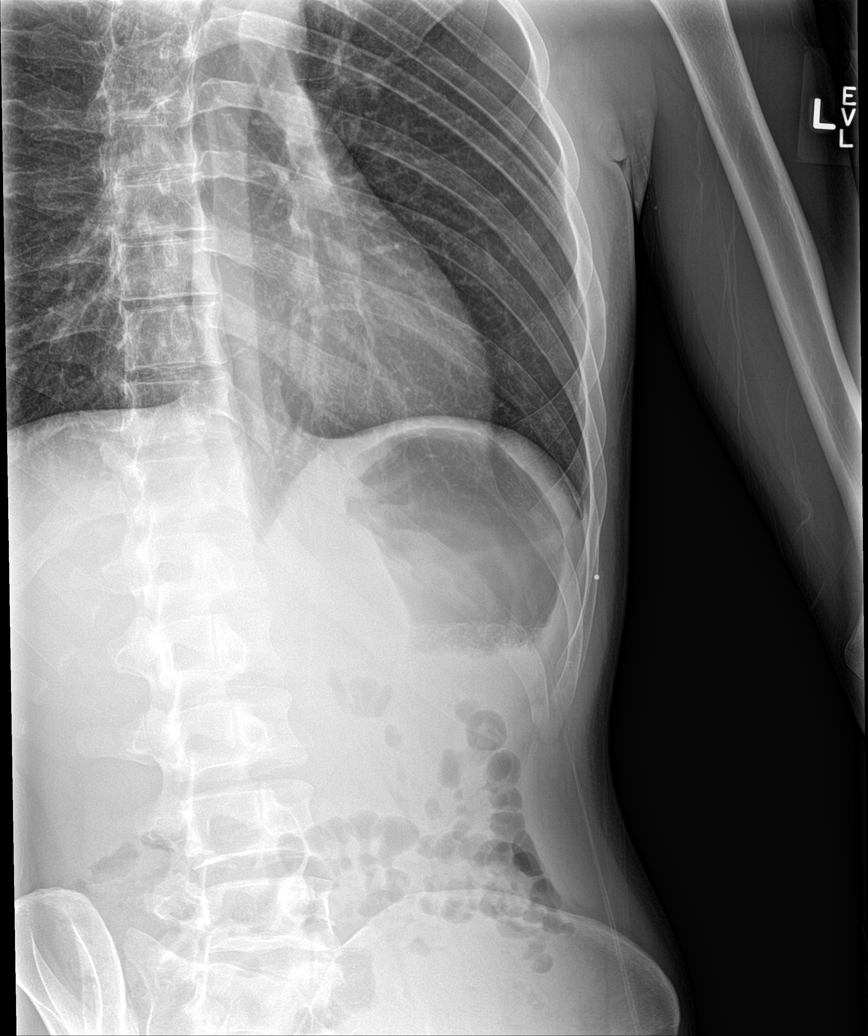

[rib pa (1 of 2)]
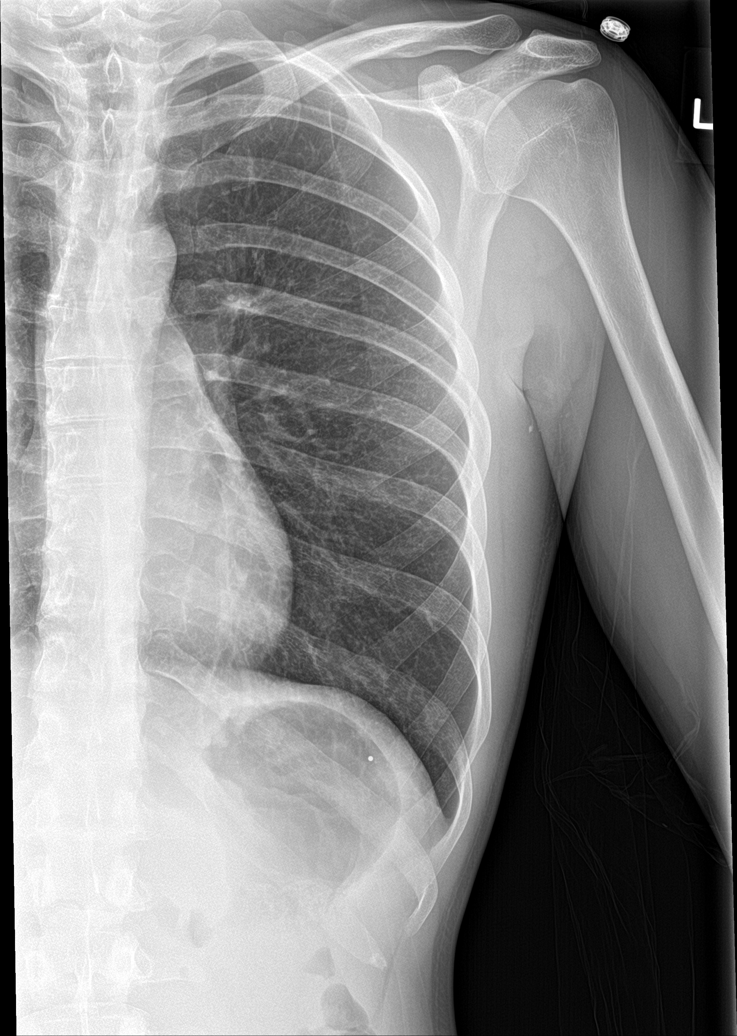

[rib pa (2 of 2)]
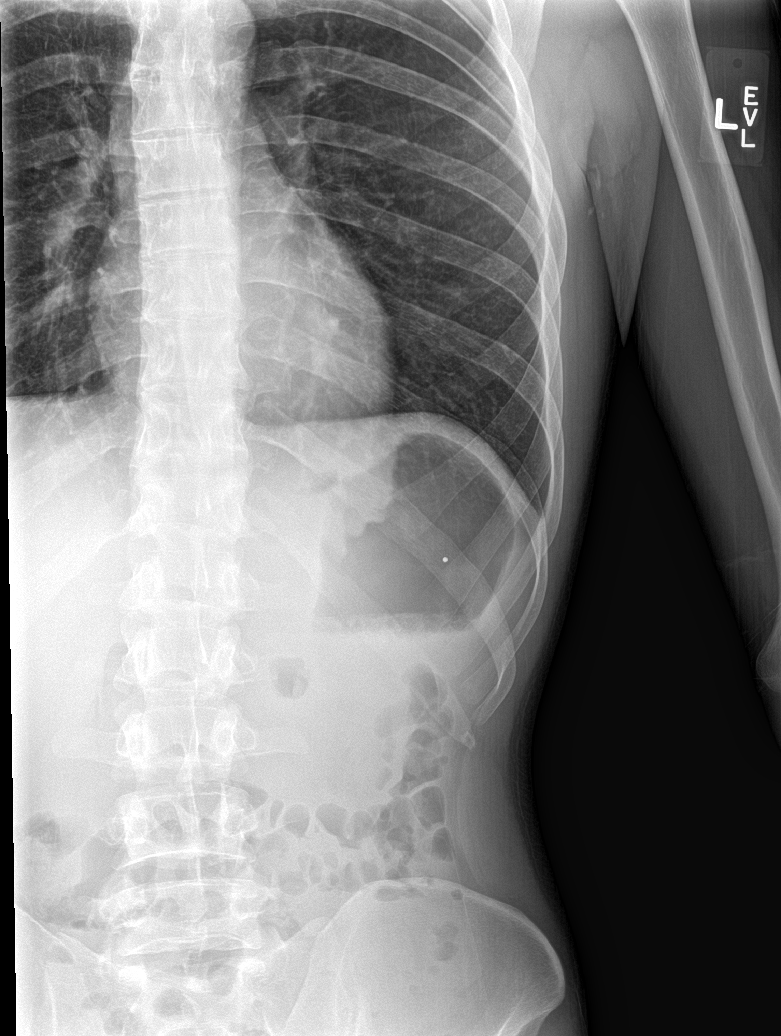

[5 of 5 positions shown; findings below may reference images not displayed]

FINDINGS: No fracture or other bone lesions are seen involving the ribs. There
is no evidence of pneumothorax or pleural effusion. Both lungs are
clear. Heart size and mediastinal contours are within normal limits.
IMPRESSION: Normal left ribs.  No acute cardiopulmonary abnormality seen.

## 2017-03-20 NOTE — Addendum Note (Signed)
Addendum  created 03/20/17 1059 by Rashidah Belleville, MD   Sign clinical note    

## 2017-07-07 ENCOUNTER — Encounter (HOSPITAL_COMMUNITY): Payer: Self-pay | Admitting: *Deleted

## 2017-07-07 ENCOUNTER — Emergency Department (HOSPITAL_COMMUNITY)
Admission: EM | Admit: 2017-07-07 | Discharge: 2017-07-07 | Disposition: A | Discharge status: Home / Self Care | Attending: Emergency Medicine | Admitting: Emergency Medicine

## 2017-07-07 DIAGNOSIS — F17210 Nicotine dependence, cigarettes, uncomplicated: Secondary | ICD-10-CM | POA: Diagnosis not present

## 2017-07-07 DIAGNOSIS — Z79899 Other long term (current) drug therapy: Secondary | ICD-10-CM | POA: Insufficient documentation

## 2017-07-07 DIAGNOSIS — X58XXXA Exposure to other specified factors, initial encounter: Secondary | ICD-10-CM | POA: Insufficient documentation

## 2017-07-07 DIAGNOSIS — S41101D Unspecified open wound of right upper arm, subsequent encounter: Secondary | ICD-10-CM | POA: Diagnosis not present

## 2017-07-07 DIAGNOSIS — S41101S Unspecified open wound of right upper arm, sequela: Secondary | ICD-10-CM

## 2017-07-07 DIAGNOSIS — R21 Rash and other nonspecific skin eruption: Secondary | ICD-10-CM | POA: Diagnosis not present

## 2017-07-07 MED ORDER — HYDROCORTISONE 1 % EX CREA: TOPICAL_CREAM | CUTANEOUS | 0 refills | Status: AC

## 2017-07-07 NOTE — ED Triage Notes (Signed)
Pt c/o wound to right axilla with open area and drainage x 1 month. Pt had joint surgery to right shoulder back in July and then a knot came up in the axilla and starting draining.   Pt also c/o red, itchy rash to right arm x 2 months.

## 2017-07-07 NOTE — Discharge Instructions (Signed)
Please complete your current course of antibiotic. A wound culture has been obtained to determine if infection still persists, or if additional antibiotic is needed. Please keep area clean and dry.  The swollen tender area below the axillary wound is likely a reactive lymph node.  Please apply hydrocortisone to the rash on your arm as directed.
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
## 2017-07-12 LAB — AEROBIC CULTURE W GRAM STAIN (SUPERFICIAL SPECIMEN)
Gram Stain: NONE SEEN
Special Requests: NORMAL

## 2017-07-12 LAB — AEROBIC CULTURE  (SUPERFICIAL SPECIMEN)

## 2017-07-13 ENCOUNTER — Telehealth: Payer: Self-pay | Admitting: Emergency Medicine

## 2017-07-13 NOTE — Telephone Encounter (Signed)
Post ED Visit - Positive Culture Follow-up  Culture report reviewed by antimicrobial stewardship pharmacist:   Enzo Bi, Pharm.D.  Celedonio Miyamoto, Pharm.D., BCPS AQ-ID  Garvin Fila, Pharm.D., BCPS  Georgina Pillion, Pharm.D., BCPS  [place], [address].D., BCPS, AAHIVP  Estella Husk, Pharm.D., BCPS, AAHIVP  Lysle Pearl, PharmD, BCPS  Casilda Carls, PharmD, BCPS  Pollyann Samples, PharmD, BCPS  Positive wound culture Treated with clindamycin, organism sensitive to the same and no further patient follow-up is required at this time.  Berle Mull 07/13/2017, 12:07 PM

## 2018-04-18 ENCOUNTER — Encounter (HOSPITAL_COMMUNITY): Payer: Self-pay | Admitting: Emergency Medicine

## 2018-04-18 ENCOUNTER — Other Ambulatory Visit: Payer: Self-pay

## 2018-04-18 ENCOUNTER — Emergency Department (HOSPITAL_COMMUNITY)
Admission: EM | Admit: 2018-04-18 | Discharge: 2018-04-18 | Disposition: A | Discharge status: Home / Self Care | Payer: PRIVATE HEALTH INSURANCE | Attending: Emergency Medicine | Admitting: Emergency Medicine

## 2018-04-18 DIAGNOSIS — M25511 Pain in right shoulder: Secondary | ICD-10-CM | POA: Diagnosis not present

## 2018-04-18 DIAGNOSIS — Z5321 Procedure and treatment not carried out due to patient leaving prior to being seen by health care provider: Secondary | ICD-10-CM | POA: Insufficient documentation

## 2018-04-18 NOTE — ED Notes (Signed)
No answer in waiting room X2,  

## 2018-04-18 NOTE — ED Notes (Signed)
No answer in waiting room X1,  
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
